# Patient Record
Sex: Female | Born: 1965 | Hispanic: Yes | Marital: Single
Health system: Southern US, Community
[De-identification: ages and names within clinical notes are randomized; demographics above are authoritative.]

## PROBLEM LIST (undated history)

## (undated) DIAGNOSIS — M1990 Unspecified osteoarthritis, unspecified site: Secondary | ICD-10-CM

## (undated) DIAGNOSIS — F32A Depression, unspecified: Secondary | ICD-10-CM

## (undated) HISTORY — DX: Depression, unspecified: F32.A

---

## 2019-11-03 ENCOUNTER — Encounter: Payer: Self-pay | Admitting: *Deleted

## 2019-11-03 ENCOUNTER — Other Ambulatory Visit: Payer: Self-pay

## 2019-11-03 ENCOUNTER — Ambulatory Visit
Admission: RE | Admit: 2019-11-03 | Discharge: 2019-11-03 | Disposition: A | Discharge status: Home / Self Care | Payer: Self-pay | Source: Ambulatory Visit | Attending: Oncology | Admitting: Oncology

## 2019-11-03 ENCOUNTER — Ambulatory Visit: Payer: Self-pay | Attending: Oncology | Admitting: *Deleted

## 2019-11-03 VITALS — BP 121/69 | HR 72 | Temp 97.5°F | Resp 20 | Ht 60.0 in | Wt 208.9 lb

## 2019-11-03 DIAGNOSIS — Z0000 Encounter for general adult medical examination without abnormal findings: Secondary | ICD-10-CM | POA: Insufficient documentation

## 2019-11-03 NOTE — Patient Instructions (Signed)
Gave patient hand-out, Women Staying Healthy, Active and Well from [facility], with education on breast health, pap smears, heart and colon health. 

## 2019-11-03 NOTE — Progress Notes (Signed)
  Subjective:     Patient ID: Kristina Roberts, female   DOB: 1965/11/29, 54 y.o.   MRN: [id number]  HPI   Review of Systems     Objective:   Physical Exam Chest:     Breasts:        Right: No swelling, bleeding, inverted nipple, mass, nipple discharge, skin change or tenderness.        Left: No swelling, bleeding, inverted nipple, mass, nipple discharge, skin change or tenderness.  Abdominal:     Palpations: There is no mass or pulsatile mass.    Genitourinary:    Exam position: Lithotomy position.     Labia:        Right: No rash, tenderness, lesion or injury.        Left: No rash, tenderness, lesion or injury.      Urethra: No prolapse or urethral pain.     Vagina: No signs of injury and foreign body. No vaginal discharge, erythema, tenderness, bleeding, lesions or prolapsed vaginal walls.     Cervix: No cervical motion tenderness, discharge, friability, lesion, erythema, cervical bleeding or eversion.     Uterus: Normal.      Adnexa:        Right: No mass.         Left: No mass.    Lymphadenopathy:     Upper Body:     Right upper body: No supraclavicular or axillary adenopathy.     Left upper body: No supraclavicular or axillary adenopathy.  Skin:         Comments: Patient states she fell this morning        Assessment:     54 year old Hispanic female presented to [facility] for clinical breast exam, pap and mammogram.  Kandis Cocking, the interpreter present during the interview and exam.  Clinical breast unremarkable.  Taught self breast awareness.  Patient thought she had a pap smear in 2019, but what she describes sounds like a pelvic ultrasound.  There were no records for a pap at the [facility] per [place].  Specimen collected for pap smear without difficulty.  The patient had a couple of bruises on her right knee and lacerations on her right elbow/arm.  She was encouraged to discuss her frequent falls with her primary care provider.  Patient has been  screened for eligibility.  She does not have any insurance, Medicare or Medicaid.  She also meets financial eligibility.   Risk Assessment    Risk Scores      11/03/2019   Last edited by: Neita Garnet, CMA   5-year risk: 0.7 %   Lifetime risk: 5 %            Plan:     Screening mammogram ordered.  Specimen for pap sent to the lab.  Will follow up per [facility] protocol.

## 2019-11-05 LAB — IGP, APTIMA HPV: HPV Aptima: NEGATIVE

## 2019-11-06 ENCOUNTER — Encounter: Payer: Self-pay | Admitting: *Deleted

## 2019-11-06 NOTE — Progress Notes (Signed)
Letter mailed to inform patient of her normal pap and mammogram.  Next mammo due in 1 year and pap smear in 5 years.

## 2019-11-12 ENCOUNTER — Other Ambulatory Visit: Payer: Self-pay

## 2019-11-12 ENCOUNTER — Emergency Department
Admission: EM | Admit: 2019-11-12 | Discharge: 2019-11-12 | Disposition: A | Discharge status: Home / Self Care | Payer: Self-pay | Attending: Emergency Medicine | Admitting: Emergency Medicine

## 2019-11-12 ENCOUNTER — Emergency Department: Payer: Self-pay

## 2019-11-12 ENCOUNTER — Encounter: Payer: Self-pay | Admitting: Emergency Medicine

## 2019-11-12 DIAGNOSIS — J810 Acute pulmonary edema: Secondary | ICD-10-CM | POA: Insufficient documentation

## 2019-11-12 DIAGNOSIS — R609 Edema, unspecified: Secondary | ICD-10-CM

## 2019-11-12 DIAGNOSIS — M25562 Pain in left knee: Secondary | ICD-10-CM | POA: Insufficient documentation

## 2019-11-12 HISTORY — DX: Unspecified osteoarthritis, unspecified site: M19.90

## 2019-11-12 LAB — TROPONIN I (HIGH SENSITIVITY)
Troponin I (High Sensitivity): 3 ng/L (ref <18)
Troponin I (High Sensitivity): 3 ng/L (ref <18)

## 2019-11-12 LAB — CBC
HCT: 46.4 % — ABNORMAL HIGH (ref 36.0–46.0)
Hemoglobin: 15.2 g/dL — ABNORMAL HIGH (ref 12.0–15.0)
MCH: 30.3 pg (ref 26.0–34.0)
MCHC: 32.8 g/dL (ref 30.0–36.0)
MCV: 92.4 fL (ref 80.0–100.0)
Platelets: 127 10*3/uL — ABNORMAL LOW (ref 150–400)
RBC: 5.02 MIL/uL (ref 3.87–5.11)
RDW: 12.7 % (ref 11.5–15.5)
WBC: 7.3 10*3/uL (ref 4.0–10.5)
nRBC: 0 % (ref 0.0–0.2)

## 2019-11-12 LAB — COMPREHENSIVE METABOLIC PANEL
ALT: 26 U/L (ref 0–44)
AST: 23 U/L (ref 15–41)
Albumin: 3.5 g/dL (ref 3.5–5.0)
Alkaline Phosphatase: 106 U/L (ref 38–126)
Anion gap: 7 (ref 5–15)
BUN: 20 mg/dL (ref 6–20)
CO2: 25 mmol/L (ref 22–32)
Calcium: 8.1 mg/dL — ABNORMAL LOW (ref 8.9–10.3)
Chloride: 105 mmol/L (ref 98–111)
Creatinine, Ser: 0.66 mg/dL (ref 0.44–1.00)
GFR calc Af Amer: >60 mL/min (ref >60)
GFR calc non Af Amer: >60 mL/min (ref >60)
Glucose, Bld: 109 mg/dL — ABNORMAL HIGH (ref 70–99)
Potassium: 3.5 mmol/L (ref 3.5–5.1)
Sodium: 137 mmol/L (ref 135–145)
Total Bilirubin: 0.8 mg/dL (ref 0.3–1.2)
Total Protein: 6.3 g/dL — ABNORMAL LOW (ref 6.5–8.1)

## 2019-11-12 MED ORDER — KETOROLAC TROMETHAMINE 30 MG/ML IJ SOLN
30.0000 mg | Freq: Once | INTRAMUSCULAR | Status: AC
  Administered 2019-11-12: 30 mg via INTRAVENOUS
  Filled 2019-11-12: qty 1

## 2019-11-12 MED ORDER — SODIUM CHLORIDE 0.9 % IV BOLUS
1000.0000 mL | Freq: Once | INTRAVENOUS | Status: AC
  Administered 2019-11-12: 1000 mL via INTRAVENOUS

## 2019-11-12 MED ORDER — FUROSEMIDE 20 MG PO TABS
20.0000 mg | ORAL_TABLET | Freq: Every day | ORAL | 0 refills | Status: AC
Start: 2019-11-12 — End: 2020-11-11

## 2019-11-12 NOTE — ED Triage Notes (Signed)
Pt in via POV w/ multiple complaints; reports swelling to bilateral knees, hands, and face.  Also reports blurred vision, dizziness, and numbness/tingling to entire face x 3 days.   Pt with difficulty ambulating due to knee pain.  Vitals WDL, NAD noted at this time.

## 2019-11-12 NOTE — Discharge Instructions (Signed)
Please call cardiology at the number provided to arrange a follow-up appointment for further evaluation.  Take your medication as prescribed for the next 7 days.  Return to the emergency department for any worsening trouble breathing, any chest pain, or any other symptom personally concerning to yourself.

## 2019-11-12 NOTE — ED Provider Notes (Signed)
[facility] Emergency Department Provider Note  Time seen: 4:37 PM  I have reviewed the triage vital signs and the nursing notes.   HISTORY  Chief Complaint Leg Swelling   HPI Kristina Roberts is a 54 y.o. female with a past medical history of arthritis presents to the emergency department for leg pain and chest pain and multiple other complaints.  According to the patient for the past 2 to 3 days she has been expensing intermittent headaches, has a sensation of swelling in her arms and legs and face.  Patient also states she has had increased left knee pain.  Does state a history of arthritis of both of her knees but the pain in left knee has been somewhat worse.  Denies any falls or trauma.  Also states for the past 3 days she has been experiencing chest pain, denies any cough congestion fever shortness of breath.  No abdominal pain.   Past Medical History:  Diagnosis Date  . Arthritis     There are no problems to display for this patient.   History reviewed. No pertinent surgical history.  Prior to Admission medications   Not on File    No Known Allergies  No family history on file.  Social History Social History   Tobacco Use  . Smoking status: Not on file  Substance Use Topics  . Alcohol use: Not on file  . Drug use: Not on file    Review of Systems Constitutional: Negative for fever. Cardiovascular: 3 days of chest pain Respiratory: Negative for shortness of breath. Gastrointestinal: Negative for abdominal pain, vomiting  Genitourinary: Negative for urinary compaints Musculoskeletal: Left knee pain Neurological: Negative for headache All other ROS negative  ____________________________________________   PHYSICAL EXAM:  VITAL SIGNS: ED Triage Vitals  Enc Vitals Group     BP 11/12/19 1425 136/79     Pulse Rate 11/12/19 1425 80     Resp 11/12/19 1425 16     Temp 11/12/19 1425 99.2 F (37.3 C)     Temp Source 11/12/19 1425  Oral     SpO2 11/12/19 1425 98 %     Weight 11/12/19 1434 220 lb (99.8 kg)     Height 11/12/19 1434 4\' 10"  (1.473 m)     Head Circumference --      Peak Flow --      Pain Score 11/12/19 1433 9     Pain Loc --      Pain Edu? --      Excl. in GC? --    Constitutional: Alert and oriented. Well appearing and in no distress. Eyes: Normal exam ENT      Head: Normocephalic and atraumatic.      Mouth/Throat: Mucous membranes are moist. Cardiovascular: Normal rate, regular rhythm Respiratory: Normal respiratory effort without tachypnea nor retractions. Breath sounds are clear  Gastrointestinal: Soft and nontender. No distention.  Musculoskeletal: Mild tenderness palpation of the left knee.  Mild pain with range of motion.  Neurovascular intact distally.  Patient has bilateral chest wall tenderness.  No edema noted. Neurologic:  Normal speech and language. No gross focal neurologic deficits Skin:  Skin is warm, dry and intact.  Psychiatric: Mood and affect are normal.   ____________________________________________    EKG  EKG viewed and interpreted by myself shows a normal sinus rhythm at 78 bpm with a narrow QRS, normal axis, normal intervals, no concerning ST changes.  ____________________________________________    RADIOLOGY  IMPRESSION:  Vascular congestion. Interstitial  prominence could reflect early  interstitial edema.   ____________________________________________   INITIAL IMPRESSION / ASSESSMENT AND PLAN / ED COURSE  Pertinent labs & imaging results that were available during my care of the patient were reviewed by me and considered in my medical decision making (see chart for details).   Patient presents to the emergency department for left knee pain, chest pain, sensation of swelling among other complaints.  Overall patient appears well, no acute distress, reassuring vitals, patient does have mild chest wall tenderness, mild left knee tenderness.  Neurovascular intact  distally.  No history of gout.  We will check uric acid as precaution.  We will check labs, treat discomfort obtain labs including cardiac enzymes and a chest x-ray.  Patient work-up shows no significant findings.  Troponin is 3, repeat is unchanged.  Chest x-ray does show mild vascular congestion.  Given the patient's complaint of subjective swelling we will place the patient on a short course of Lasix have the patient follow-up with cardiology for further evaluation and possible echocardiogram.  I discussed this with the interpreter present.  Patient agreeable to plan of care.  Kristina Roberts was evaluated in Emergency Department on 11/12/2019 for the symptoms described in the history of present illness. She was evaluated in the context of the global COVID-19 pandemic, which necessitated consideration that the patient might be at risk for infection with the SARS-CoV-2 virus that causes COVID-19. Institutional protocols and algorithms that pertain to the evaluation of patients at risk for COVID-19 are in a state of rapid change based on information released by regulatory bodies including the CDC and federal and state organizations. These policies and algorithms were followed during the patient's care in the ED.  ____________________________________________   FINAL CLINICAL IMPRESSION(S) / ED DIAGNOSES  Chest pain Knee pain Vascular congestion   Minna Antis, MD 11/12/19 1954

## 2019-11-13 LAB — GLUCOSE, CAPILLARY: Glucose-Capillary: 124 mg/dL — ABNORMAL HIGH (ref 70–99)

## 2020-11-11 ENCOUNTER — Other Ambulatory Visit: Payer: Self-pay

## 2020-11-11 ENCOUNTER — Ambulatory Visit (LOCAL_COMMUNITY_HEALTH_CENTER): Payer: Self-pay

## 2020-11-11 DIAGNOSIS — Z23 Encounter for immunization: Secondary | ICD-10-CM

## 2020-11-11 NOTE — Progress Notes (Signed)
Patient presents to nurse clinic with daughter for immunizations. Kristina Roberts served as an Astronomer for this visit. Patient states she had a immigration appointment 06/29 and was instructed to obtain the following vaccines Hep B, MMR, Varicella, and Tdap required vaccines for immigration process. Pt reports no hx of chickenpox or hx of any of these vaccines. No record found in [place] or Epic. [employer] patient assistance form was completed for varivax vaccine and successfully faxed over to [employer]. No call was received from [employer] so S. Carlota Raspberry, RN called with no answer automated message recording states closed for the holiday. Will follow up with patient assistance program next week. All vaccines were administered and tolerated well. [facility] updated and copies given to patient.

## 2020-11-15 ENCOUNTER — Telehealth (LOCAL_COMMUNITY_HEALTH_CENTER): Payer: Self-pay

## 2020-11-15 DIAGNOSIS — Z23 Encounter for immunization: Secondary | ICD-10-CM

## 2020-11-15 NOTE — Telephone Encounter (Signed)
Phone call from [employer] Patient Assistance Program. Rep states pt meets criteria for [employer] to cover cost of varivax vaccine. Approval given by rep. Confirmation # given to RN and placed on Pt Asst Program application. Pt received varivax while at [facility] on 11/11/2020. Jerel Shepherd, RN

## 2020-12-06 ENCOUNTER — Telehealth: Payer: Self-pay

## 2020-12-06 ENCOUNTER — Ambulatory Visit: Payer: Self-pay

## 2020-12-06 NOTE — Telephone Encounter (Signed)
Phone call attempted to pt via interpreter M. [place] Pt not available. Spoke with Tawana Scale, daughter-in-law who speaks English. RN explained that pt has immunization appt at [facility] this afternoon (2:30pm) and appt needs to be rescheduled as it is too early to administer 2nd doses of MMR and Varicella.Earliest date for next dose would be 12/09/2020 since first doses administered 11/11/2020. M. Odetta Pink reports understanding and says pt has immigration appt in [facility] 12/09/2020. Ms Odetta Pink plans to reschedule next vaccines after 12/09/2020. [facility] appt number given. Questions answered. Josie Saunders, RN

## 2020-12-08 IMAGING — MG DIGITAL SCREENING BILAT W/ TOMO W/ CAD
8 of 14 series · 8 of 40 positions shown · non-contrast
Comparison: None.

CLINICAL DATA: Screening.

EXAM:
DIGITAL SCREENING BILATERAL MAMMOGRAM WITH TOMO AND CAD

[R CV synth-2D]
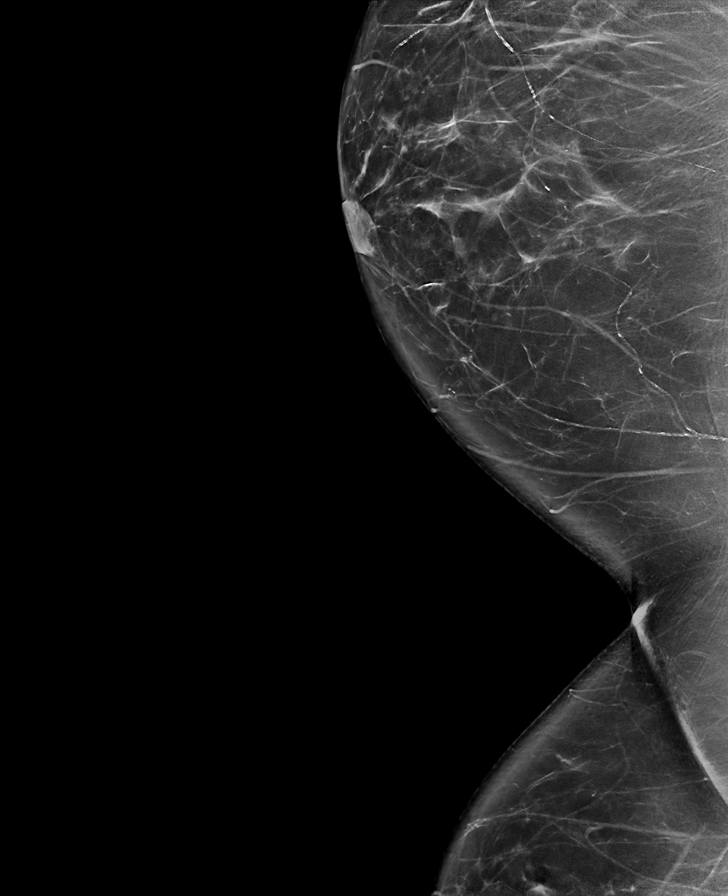

[R MLO synth-2D]
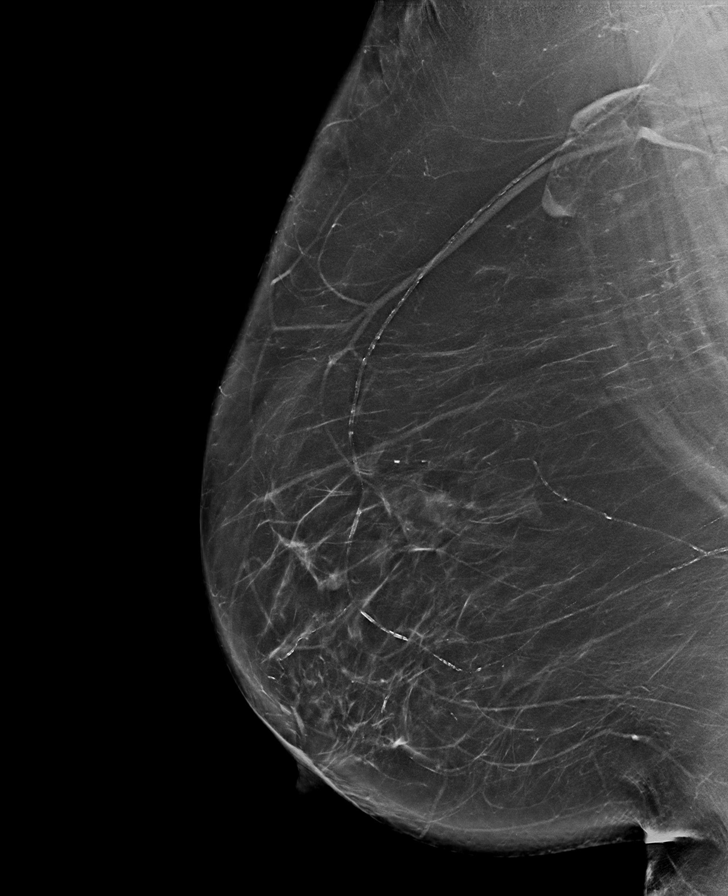

[L MLO synth-2D]
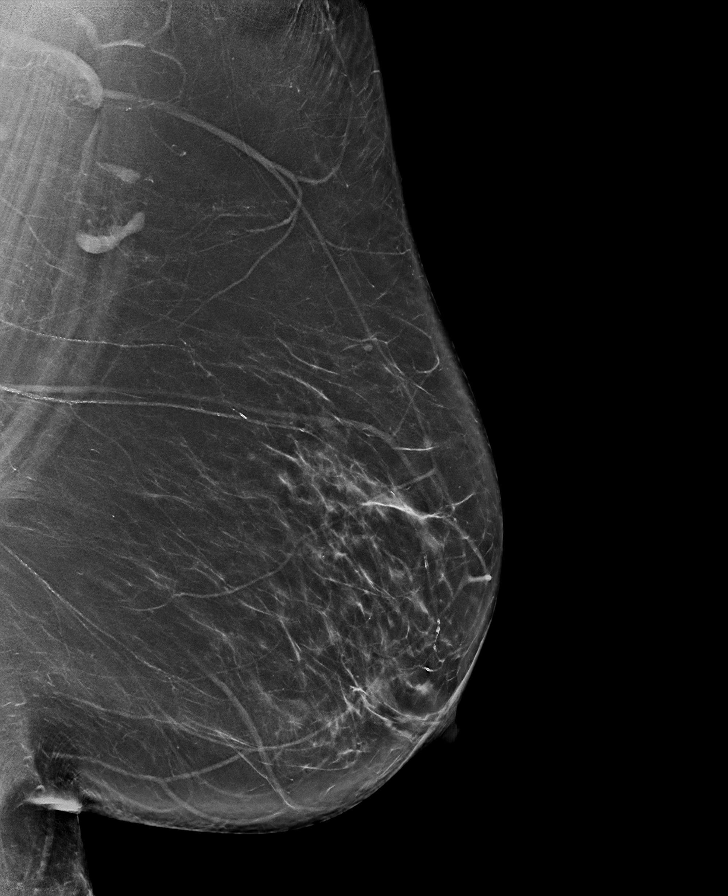

[R CC synth-2D (1 of 2)]
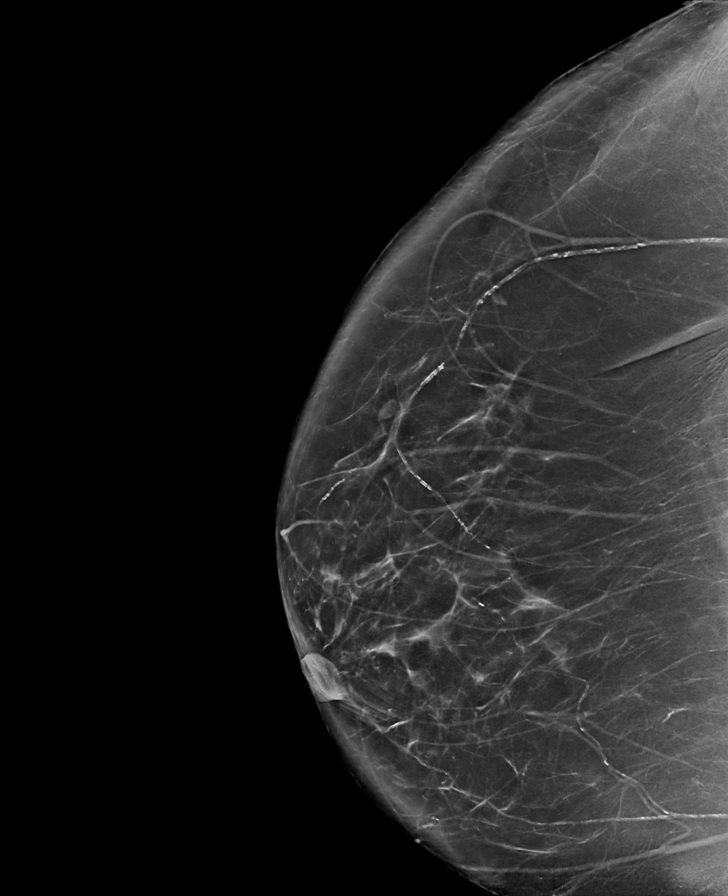

[L XCCL synth-2D]
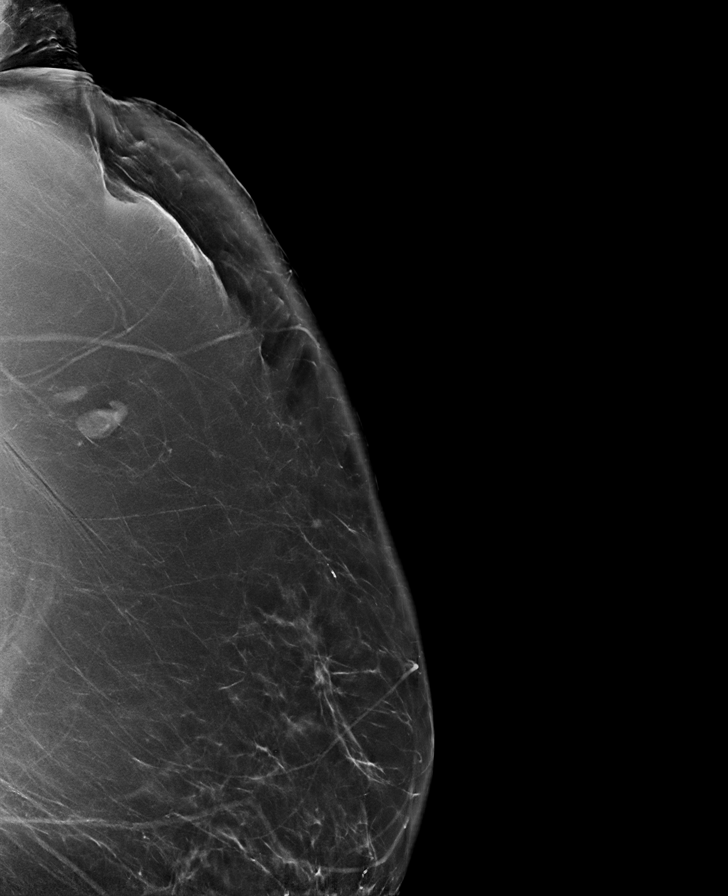

[R CC synth-2D (2 of 2)]
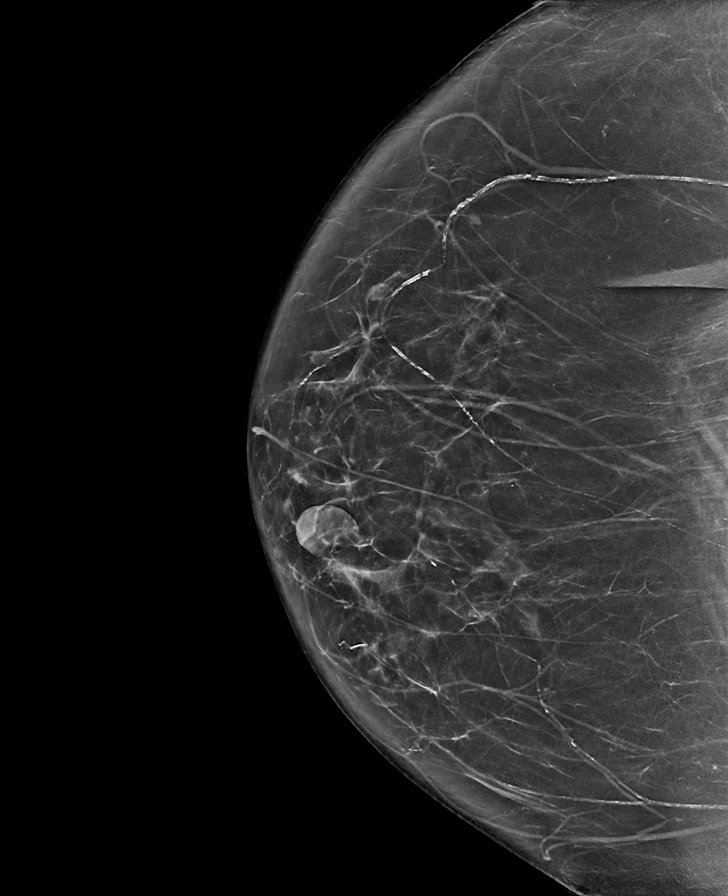

[L CC synth-2D]
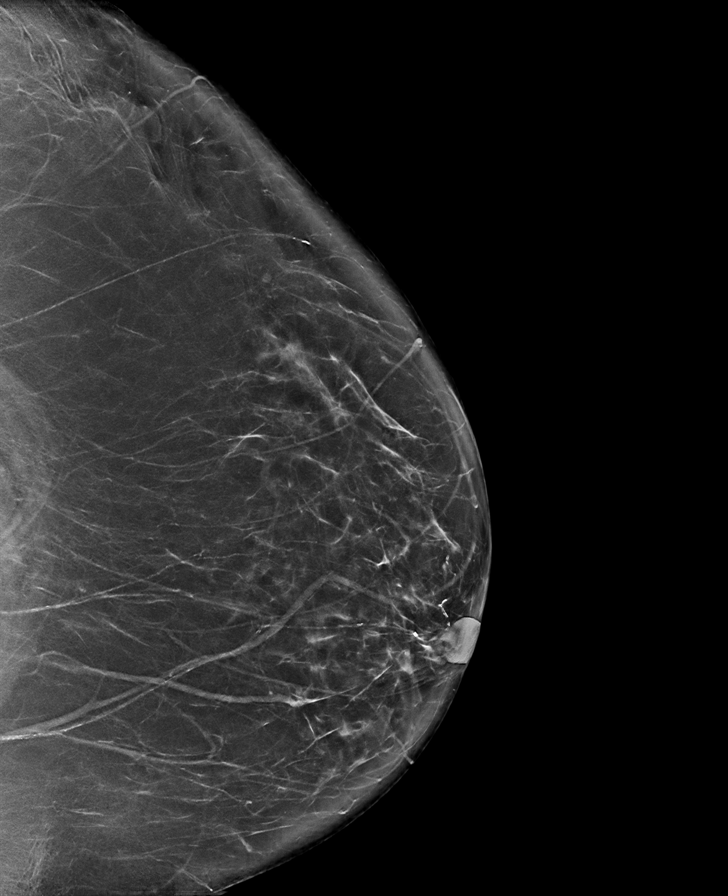

[R CV tomo · tomo slice 43/84.0]
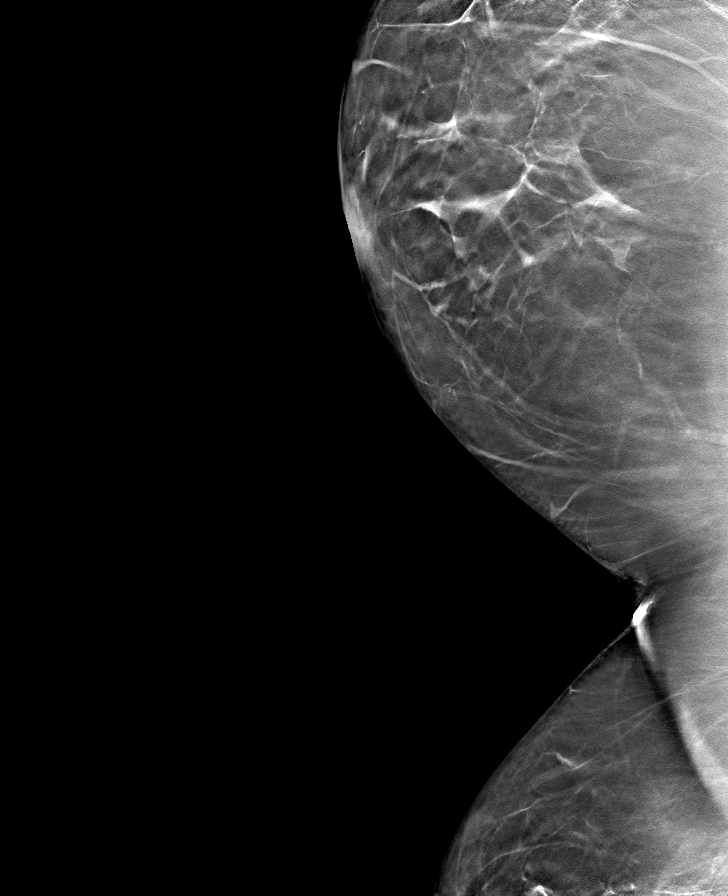

[8 of 40 positions shown; findings below may reference images not displayed]

ACR Breast Density Category b: There are scattered areas of
fibroglandular density.
FINDINGS: There are no findings suspicious for malignancy. Images were
processed with CAD.
IMPRESSION: No mammographic evidence of malignancy. A result letter of this
screening mammogram will be mailed directly to the patient.

RECOMMENDATION:
Screening mammogram in one year. (Code:Y5-G-EJ6)

BI-RADS CATEGORY  1: Negative.

## 2020-12-17 IMAGING — DX DG CHEST 1V PORT
1 series · 1 of 1 positions shown · non-contrast
Comparison: None.

CLINICAL DATA: Chest pain

EXAM:
PORTABLE CHEST 1 VIEW

[chest ap]
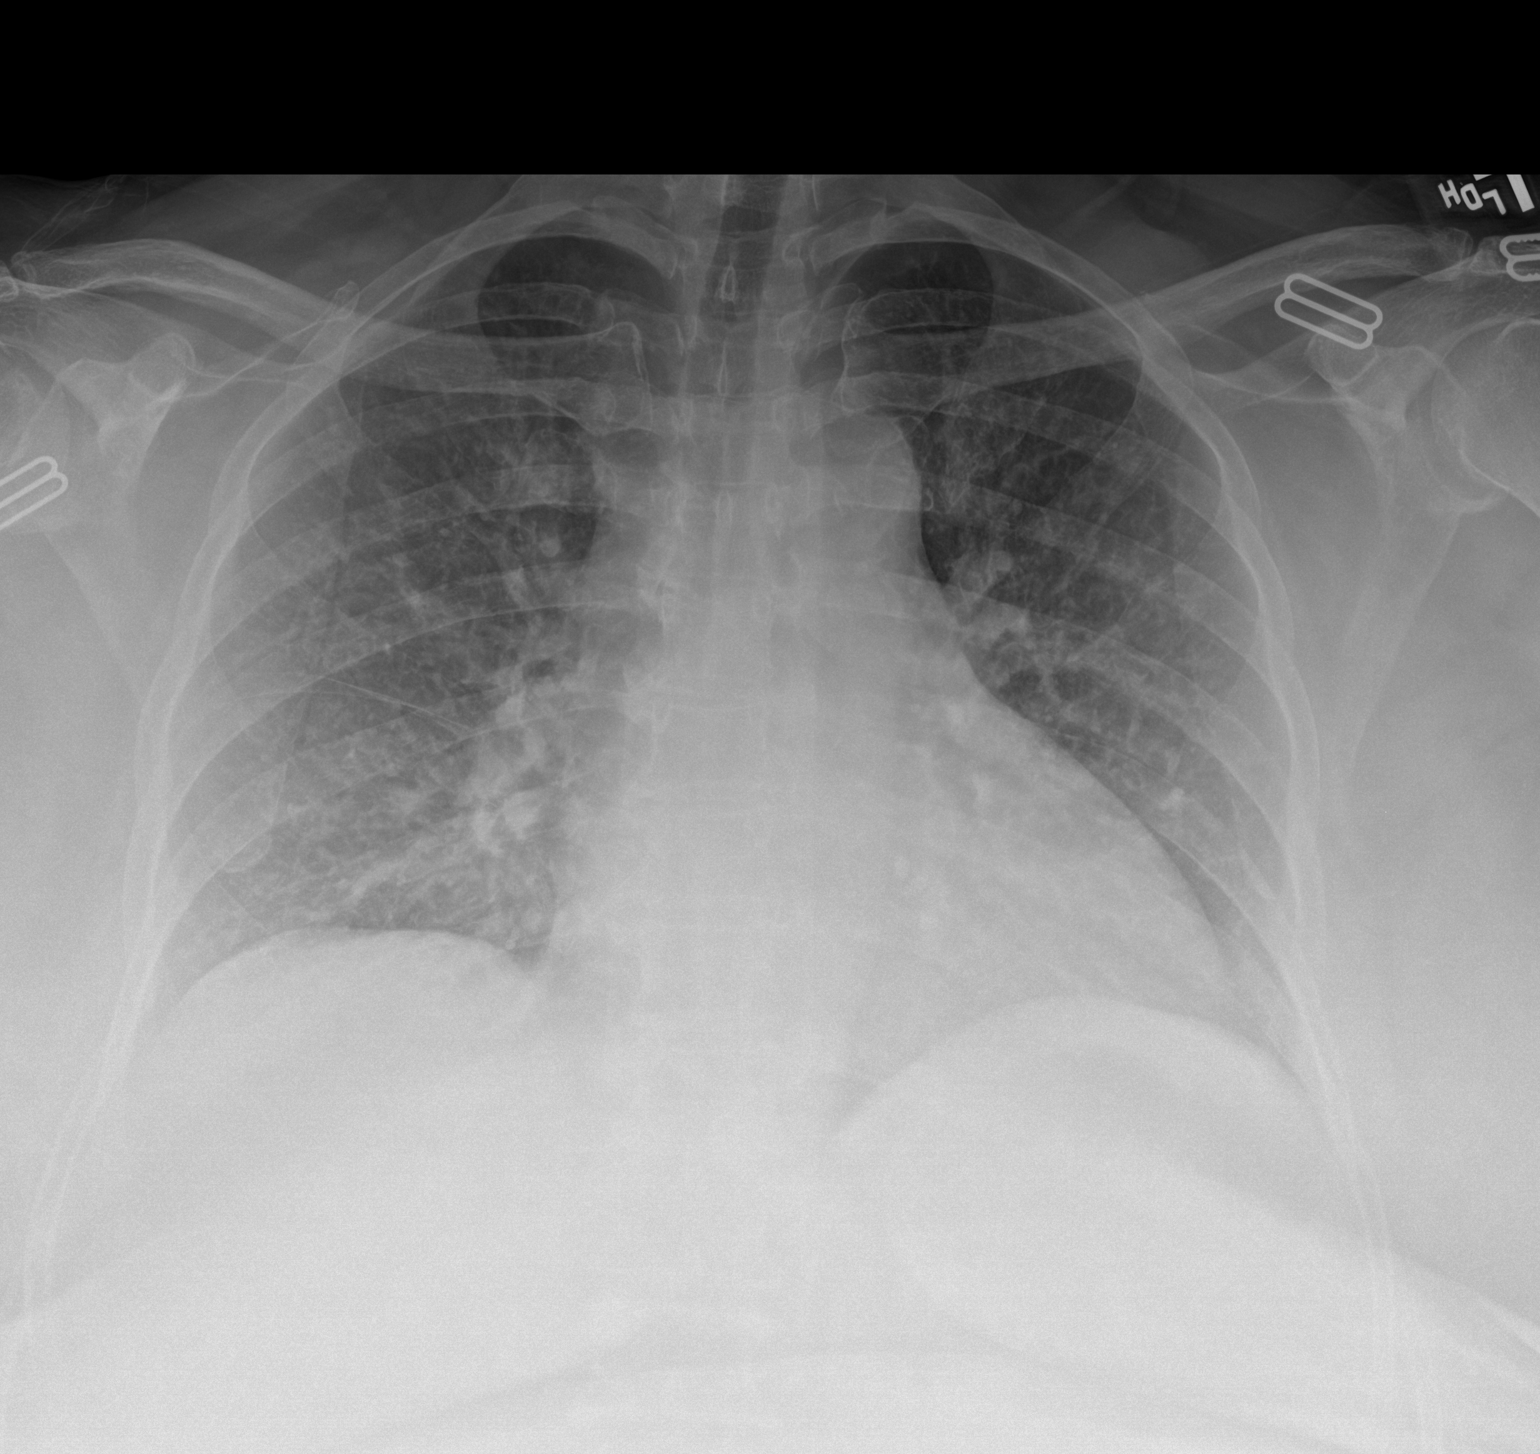

[1 of 1 positions shown; findings below may reference images not displayed]

FINDINGS: Heart is borderline in size. No confluent opacities, effusions. Mild
vascular congestion and interstitial prominence. No acute bony
abnormality.
IMPRESSION: Vascular congestion. Interstitial prominence could reflect early
interstitial edema.

## 2020-12-26 ENCOUNTER — Telehealth: Payer: Self-pay | Admitting: Nurse Practitioner

## 2020-12-26 NOTE — Telephone Encounter (Signed)
Telephone call to patient regarding immunization appointment.  Patient was in clinic on 11/11/20 where she received a pediatric dose of Hep B vaccine.  Patient notified of error and rescheduled for 01/09/21 to receive an adult dose of Hep B. Glenna Fellows, RN

## 2021-01-09 ENCOUNTER — Ambulatory Visit (LOCAL_COMMUNITY_HEALTH_CENTER): Payer: Self-pay

## 2021-01-09 ENCOUNTER — Other Ambulatory Visit: Payer: Self-pay

## 2021-01-09 DIAGNOSIS — Z23 Encounter for immunization: Secondary | ICD-10-CM

## 2021-01-09 NOTE — Progress Notes (Signed)
In Nurse Clinic with daughter-in-law for vaccines. No health insurance coverage. Meets criteria for state supply Tdap (dose #2), which was given and tolerated well. Meets criteria for [employer] Patient asst Program for MMR and Hep B (adult). PAP applications completed, submitted, and approved. Tolerated MMR and Hep B well today. Updated [facility] copy given and explained. Dot Lanes, interpreter. Also, daughter-in-law speaks [place]. Questions answered and reports understanding. Josie Saunders, RN

## 2021-06-06 ENCOUNTER — Emergency Department: Payer: Self-pay

## 2021-06-06 ENCOUNTER — Emergency Department
Admission: EM | Admit: 2021-06-06 | Discharge: 2021-06-06 | Disposition: A | Discharge status: Home / Self Care | Payer: Self-pay | Attending: Emergency Medicine | Admitting: Emergency Medicine

## 2021-06-06 ENCOUNTER — Other Ambulatory Visit: Payer: Self-pay

## 2021-06-06 DIAGNOSIS — K439 Ventral hernia without obstruction or gangrene: Secondary | ICD-10-CM | POA: Insufficient documentation

## 2021-06-06 DIAGNOSIS — Z20822 Contact with and (suspected) exposure to covid-19: Secondary | ICD-10-CM | POA: Insufficient documentation

## 2021-06-06 LAB — URINALYSIS, ROUTINE W REFLEX MICROSCOPIC
Bilirubin Urine: NEGATIVE
Glucose, UA: NEGATIVE mg/dL
Hgb urine dipstick: NEGATIVE
Ketones, ur: NEGATIVE mg/dL
Leukocytes,Ua: NEGATIVE
Nitrite: NEGATIVE
Protein, ur: NEGATIVE mg/dL
Specific Gravity, Urine: 1.02 (ref 1.005–1.030)
pH: 5.5 (ref 5.0–8.0)

## 2021-06-06 LAB — COMPREHENSIVE METABOLIC PANEL
ALT: 16 U/L (ref 0–44)
AST: 21 U/L (ref 15–41)
Albumin: 4 g/dL (ref 3.5–5.0)
Alkaline Phosphatase: 94 U/L (ref 38–126)
Anion gap: 7 (ref 5–15)
BUN: 21 mg/dL — ABNORMAL HIGH (ref 6–20)
CO2: 25 mmol/L (ref 22–32)
Calcium: 9 mg/dL (ref 8.9–10.3)
Chloride: 104 mmol/L (ref 98–111)
Creatinine, Ser: 0.55 mg/dL (ref 0.44–1.00)
GFR, Estimated: >60 mL/min (ref >60)
Glucose, Bld: 88 mg/dL (ref 70–99)
Potassium: 4 mmol/L (ref 3.5–5.1)
Sodium: 136 mmol/L (ref 135–145)
Total Bilirubin: 0.7 mg/dL (ref 0.3–1.2)
Total Protein: 7.4 g/dL (ref 6.5–8.1)

## 2021-06-06 LAB — MONONUCLEOSIS SCREEN: Mono Screen: NEGATIVE

## 2021-06-06 LAB — CBC
HCT: 46.1 % — ABNORMAL HIGH (ref 36.0–46.0)
Hemoglobin: 15.6 g/dL — ABNORMAL HIGH (ref 12.0–15.0)
MCH: 30.5 pg (ref 26.0–34.0)
MCHC: 33.8 g/dL (ref 30.0–36.0)
MCV: 90 fL (ref 80.0–100.0)
Platelets: 124 10*3/uL — ABNORMAL LOW (ref 150–400)
RBC: 5.12 MIL/uL — ABNORMAL HIGH (ref 3.87–5.11)
RDW: 12.2 % (ref 11.5–15.5)
WBC: 6.2 10*3/uL (ref 4.0–10.5)
nRBC: 0 % (ref 0.0–0.2)

## 2021-06-06 LAB — LIPASE, BLOOD: Lipase: 30 U/L (ref 11–51)

## 2021-06-06 LAB — RESP PANEL BY RT-PCR (FLU A&B, COVID) ARPGX2
Influenza A by PCR: NEGATIVE
Influenza B by PCR: NEGATIVE
SARS Coronavirus 2 by RT PCR: NEGATIVE

## 2021-06-06 LAB — POC URINE PREG, ED: Preg Test, Ur: NEGATIVE

## 2021-06-06 MED ORDER — IBUPROFEN 600 MG PO TABS: 600.0000 mg | ORAL_TABLET | Freq: Four times a day (QID) | ORAL | 0 refills | Status: AC | PRN

## 2021-06-06 MED ORDER — IOHEXOL 300 MG/ML  SOLN
100.0000 mL | Freq: Once | INTRAMUSCULAR | Status: AC | PRN
  Administered 2021-06-06: 15:00:00 100 mL via INTRAVENOUS
  Filled 2021-06-06: qty 100

## 2021-06-06 MED ORDER — ONDANSETRON 4 MG PO TBDP: 4.0000 mg | ORAL_TABLET | Freq: Three times a day (TID) | ORAL | 0 refills | Status: AC | PRN

## 2021-06-06 MED ORDER — FENTANYL CITRATE PF 50 MCG/ML IJ SOSY
50.0000 ug | PREFILLED_SYRINGE | Freq: Once | INTRAMUSCULAR | Status: AC
  Administered 2021-06-06: 15:00:00 50 ug via INTRAVENOUS
  Filled 2021-06-06: qty 1

## 2021-06-06 MED ORDER — KETOROLAC TROMETHAMINE 30 MG/ML IJ SOLN
15.0000 mg | Freq: Once | INTRAMUSCULAR | Status: AC
  Administered 2021-06-06: 16:00:00 15 mg via INTRAVENOUS
  Filled 2021-06-06: qty 1

## 2021-06-06 MED ORDER — ONDANSETRON HCL 4 MG/2ML IJ SOLN
4.0000 mg | Freq: Once | INTRAMUSCULAR | Status: AC
  Administered 2021-06-06: 15:00:00 4 mg via INTRAVENOUS
  Filled 2021-06-06: qty 2

## 2021-06-06 NOTE — ED Triage Notes (Signed)
Pt to ED via POV with daughter. Pt c/o generalized abdominal pain. Pt recently dx with hernia. Pt stating pain has been getting works x1 wk. Pt also reports nausea and dizziness that started this morning.

## 2021-06-06 NOTE — ED Provider Notes (Addendum)
[facility] Provider Note    Event Date/Time   First MD Initiated Contact with Patient 06/06/21 1357     (approximate)   History   Abdominal Pain   HPI  Kristina Roberts is a 56 y.o. female with arthritis who comes in with abdominal pain.  Patient was diagnosed with a hernia but is reporting having worsening pain with some nausea.  Patient reports worsening right lower quadrant pain for the past 3 days, constant.  Reports of associated nausea with no vomiting.  Has normal bowel movements denies urinary symptoms chest pain, shortness of breath  Spent interpreter was used   Physical Exam   Triage Vital Signs: ED Triage Vitals  Enc Vitals Group     BP 06/06/21 1209 133/83     Pulse Rate 06/06/21 1209 64     Resp 06/06/21 1209 20     Temp 06/06/21 1209 (!) 97.5 F (36.4 C)     Temp Source 06/06/21 1209 Oral     SpO2 06/06/21 1209 97 %     Weight 06/06/21 1212 220 lb 0.3 oz (99.8 kg)     Height 06/06/21 1212 4\' 10"  (1.473 m)     Head Circumference --      Peak Flow --      Pain Score 06/06/21 1212 9     Pain Loc --      Pain Edu? --      Excl. in GC? --     Most recent vital signs: Vitals:   06/06/21 1209  BP: 133/83  Pulse: 64  Resp: 20  Temp: (!) 97.5 F (36.4 C)  SpO2: 97%     General: Awake, no distress.  Appears uncomfortable CV:  Good peripheral perfusion.  Resp:  Normal effort.  Abd:  No distention.  Tender right lower quadrant.  No evidence of infection or need for pannus    ED Results / Procedures / Treatments   Labs (all labs ordered are listed, but only abnormal results are displayed) Labs Reviewed  COMPREHENSIVE METABOLIC PANEL - Abnormal; Notable for the following components:      Result Value   BUN 21 (*)    All other components within normal limits  CBC - Abnormal; Notable for the following components:   RBC 5.12 (*)    Hemoglobin 15.6 (*)    HCT 46.1 (*)    Platelets 124 (*)    All other components within  normal limits  LIPASE, BLOOD  URINALYSIS, ROUTINE W REFLEX MICROSCOPIC  POC URINE PREG, ED     RADIOLOGY CT pending reviewed images personally but pending read from rads    PROCEDURES:  Critical Care performed: No  Procedures   MEDICATIONS ORDERED IN ED: Medications  fentaNYL (SUBLIMAZE) injection 50 mcg (50 mcg Intravenous Given 06/06/21 1435)  ondansetron (ZOFRAN) injection 4 mg (4 mg Intravenous Given 06/06/21 1435)  iohexol (OMNIPAQUE) 300 MG/ML solution 100 mL (100 mLs Intravenous Contrast Given 06/06/21 1505)     IMPRESSION / MDM / ASSESSMENT AND PLAN / ED COURSE  I reviewed the triage vital signs and the nursing notes.                              Differential diagnosis includes, but is not limited to, appendicitis, diverticulitis, perforation, obstruction.  We will proceed with CT imaging.  We will give some IV fentanyl and IV Zofran to help with pain while waiting  for results   CBC with normal white count.  Hemoglobin stable CMP with normal LFTs Lipase no evidence of pancreatitis UA no evidence of infection  CT imaging with large ventral hernia but no evidence of appendicitis.  There is concern for some fat inferior to the umbilicus.  Will consult surgery given the significant amount of pain associated with it  D/w Dr. Maia Plan who reviewed the imaging and did not feel like patient needed emergent surgery.  We will do p.o. challenge and give some Toradol to help with the rest of the pain and then patient can be discharged home.  Discussed follow-up with surgery outpatient expressed understanding felt comfortable with this plan.     FINAL CLINICAL IMPRESSION(S) / ED DIAGNOSES   Final diagnoses:  Ventral hernia without obstruction or gangrene     Rx / DC Orders   ED Discharge Orders          Ordered    ondansetron (ZOFRAN-ODT) 4 MG disintegrating tablet  Every 8 hours PRN        06/06/21 1556    ibuprofen (ADVIL) 600 MG tablet  Every 6 hours PRN         06/06/21 1558             Note:  This document was prepared using Dragon voice recognition software and may include unintentional dictation errors.   Concha Se, MD 06/06/21 1522    Concha Se, MD 06/06/21 1543    Concha Se, MD 06/06/21 [phone]

## 2021-06-06 NOTE — Discharge Instructions (Addendum)
Take Tylenol 1 g every 8 hours and the ibuprofen to help with any pain.  Call the surgeon number to get follow-up  Mono test pending  Avoid trauma to abdomen given enlarged spleen.   IMPRESSION: There is no evidence of intestinal obstruction or pneumoperitoneum. Appendix is not dilated. There is no hydronephrosis.   Enlarged liver.  Enlarged spleen.   There is large ventral hernia with hernial sac measuring 18 x 4.3 x 8.4 cm containing fat inferior to the umbilicus. Hernial sac is in the subcutaneous plane in the right lower anterior abdominal wall.   Few diverticula are seen in the colon without signs of focal diverticulitis.

## 2021-06-06 NOTE — ED Provider Triage Note (Signed)
Emergency Medicine Provider Triage Evaluation Note  History limited by Spanish language. Adult daughter provides interim HPI.  Kristina Roberts, a 56 y.o. female  was evaluated in triage.  Pt complains of generalized abdominal pain.  Patient reports increasing abdominal pain since last week.  She has some nausea without vomiting and dizziness started this morning.  Review of Systems  Positive: Generalized abd pain, nausea Negative: vomiitng  Physical Exam  BP 133/83 (BP Location: Left Arm)    Pulse 64    Temp (!) 97.5 F (36.4 C) (Oral)    Resp 20    Ht 4\' 10"  (1.473 m)    Wt 99.8 kg    SpO2 97%    BMI 45.98 kg/m  Gen:   Awake, no distress   Resp:  Normal effort  MSK:   Moves extremities without difficulty  Other:  CVS: RRR  Medical Decision Making  Medically screening exam initiated at 12:21 PM.  Appropriate orders placed.  Kristina Roberts was informed that the remainder of the evaluation will be completed by another provider, this initial triage assessment does not replace that evaluation, and the importance of remaining in the ED until their evaluation is complete.  Patient ED evaluation of generalized abdominal pain for the last week.   Melvenia Needles, PA-C 06/06/21 1225

## 2022-09-05 ENCOUNTER — Other Ambulatory Visit: Payer: Self-pay

## 2022-09-05 DIAGNOSIS — Z1231 Encounter for screening mammogram for malignant neoplasm of breast: Secondary | ICD-10-CM

## 2022-10-01 ENCOUNTER — Other Ambulatory Visit: Payer: Self-pay

## 2022-10-01 ENCOUNTER — Ambulatory Visit: Payer: Self-pay | Attending: Hematology and Oncology | Admitting: Hematology and Oncology

## 2022-10-01 ENCOUNTER — Ambulatory Visit
Admission: RE | Admit: 2022-10-01 | Discharge: 2022-10-01 | Disposition: A | Discharge status: Home / Self Care | Payer: Self-pay | Source: Ambulatory Visit | Attending: Obstetrics and Gynecology | Admitting: Obstetrics and Gynecology

## 2022-10-01 VITALS — BP 136/80 | Wt 190.7 lb

## 2022-10-01 DIAGNOSIS — Z1231 Encounter for screening mammogram for malignant neoplasm of breast: Secondary | ICD-10-CM

## 2022-10-01 DIAGNOSIS — Z1211 Encounter for screening for malignant neoplasm of colon: Secondary | ICD-10-CM

## 2022-10-01 NOTE — Patient Instructions (Signed)
Taught Kristina Roberts about self breast awareness and gave educational materials to take home. Patient did not need a Pap smear today due to last Pap smear was in 11/03/2019 per patient. This will be due in 2026. Let her know [facility] will cover Pap smears every 5 years unless has a history of abnormal Pap smears. [facility] [place] for [facility]. Appointment scheduled for 10/01/2022. Patient aware of appointment and will be there. Let patient know will follow up with her within the next couple weeks with results. Kristina Roberts verbalized understanding.  Pascal Lux, NP 8:26 AM

## 2022-10-01 NOTE — Progress Notes (Signed)
Ms. Kristina Roberts is a 57 y.o. female who presents to [facility] clinic today with no complaints.    Pap Smear: Pap not smear completed today. Last Pap smear was 11/03/2019 at [facility] and was normal. Per patient has no history of an abnormal Pap smear. Last Pap smear result is available in Epic.   Physical exam: Breasts Breasts symmetrical. No skin abnormalities bilateral breasts. No nipple retraction bilateral breasts. No nipple discharge bilateral breasts. No lymphadenopathy. No lumps palpated bilateral breasts.  MS DIGITAL SCREENING TOMO BILATERAL  Result Date: 11/04/2019 CLINICAL DATA:  Screening. EXAM: DIGITAL SCREENING BILATERAL MAMMOGRAM WITH TOMO AND CAD COMPARISON:  None. ACR Breast Density Category b: There are scattered areas of fibroglandular density. FINDINGS: There are no findings suspicious for malignancy. Images were processed with CAD. IMPRESSION: No mammographic evidence of malignancy. A result letter of this screening mammogram will be mailed directly to the patient. RECOMMENDATION: Screening mammogram in one year. (Code:[id number]-B-01Y) BI-RADS CATEGORY  1: Negative. Electronically Signed   By: Emmaline Kluver M.D.   On: 11/04/2019 09:25         Pelvic/Bimanual Pap is not indicated today    Smoking History: Patient has never smoked and was not referred to quit line.    Patient Navigation: Patient education provided. Access to services provided for patient through [facility] program. Lowella Fairy interpreter provided. No transportation provided   Colorectal Cancer Screening: Per patient has never had colonoscopy completed No complaints today. FIT test given.    Breast and Cervical Cancer Risk Assessment: Patient does not have family history of breast cancer, known genetic mutations, or radiation treatment to the chest before age 70. Patient does not have history of cervical dysplasia, immunocompromised, or DES exposure in-utero.  Risk Assessment   No risk assessment  data for the current encounter  Risk Scores       11/03/2019   Last edited by: Neita Garnet, CMA   5-year risk: 0.7 %   Lifetime risk: 5 %            A: [facility] exam without pap smear No complaints with benign exam.   P: Referred patient to the [facility] [place] for a screening mammogram. Appointment scheduled 10/01/22.  Pascal Lux, NP 10/01/2022 8:18 AM
